# Patient Record
Sex: Male | Born: 2000 | Race: White | Hispanic: No | Marital: Single | Smoking: Never smoker
Health system: Southern US, Community
[De-identification: ages and names within clinical notes are randomized; demographics above are authoritative.]

## PROBLEM LIST (undated history)

## (undated) DIAGNOSIS — H539 Unspecified visual disturbance: Secondary | ICD-10-CM

## (undated) DIAGNOSIS — T7840XA Allergy, unspecified, initial encounter: Secondary | ICD-10-CM

## (undated) DIAGNOSIS — A4902 Methicillin resistant Staphylococcus aureus infection, unspecified site: Secondary | ICD-10-CM

## (undated) HISTORY — DX: Allergy, unspecified, initial encounter: T78.40XA

## (undated) HISTORY — DX: Unspecified visual disturbance: H53.9

## (undated) HISTORY — DX: Methicillin resistant Staphylococcus aureus infection, unspecified site: A49.02

---
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
## 2022-10-20 DIAGNOSIS — Z0000 Encounter for general adult medical examination without abnormal findings: Secondary | ICD-10-CM | POA: Diagnosis not present

## 2022-10-20 DIAGNOSIS — R7989 Other specified abnormal findings of blood chemistry: Secondary | ICD-10-CM | POA: Diagnosis not present

## 2022-11-12 DIAGNOSIS — Z1331 Encounter for screening for depression: Secondary | ICD-10-CM | POA: Diagnosis not present

## 2022-11-12 DIAGNOSIS — Z0000 Encounter for general adult medical examination without abnormal findings: Secondary | ICD-10-CM | POA: Diagnosis not present

## 2022-11-12 DIAGNOSIS — Z1339 Encounter for screening examination for other mental health and behavioral disorders: Secondary | ICD-10-CM | POA: Diagnosis not present

## 2023-11-11 DIAGNOSIS — Z0189 Encounter for other specified special examinations: Secondary | ICD-10-CM | POA: Diagnosis not present

## 2023-11-11 DIAGNOSIS — R7989 Other specified abnormal findings of blood chemistry: Secondary | ICD-10-CM | POA: Diagnosis not present

## 2023-11-11 DIAGNOSIS — Z0000 Encounter for general adult medical examination without abnormal findings: Secondary | ICD-10-CM | POA: Diagnosis not present

## 2023-11-18 DIAGNOSIS — D229 Melanocytic nevi, unspecified: Secondary | ICD-10-CM | POA: Diagnosis not present

## 2023-11-18 DIAGNOSIS — Z1331 Encounter for screening for depression: Secondary | ICD-10-CM | POA: Diagnosis not present

## 2023-11-18 DIAGNOSIS — Z1339 Encounter for screening examination for other mental health and behavioral disorders: Secondary | ICD-10-CM | POA: Diagnosis not present

## 2023-11-18 DIAGNOSIS — Z0000 Encounter for general adult medical examination without abnormal findings: Secondary | ICD-10-CM | POA: Diagnosis not present

## 2023-12-15 DIAGNOSIS — D225 Melanocytic nevi of trunk: Secondary | ICD-10-CM | POA: Diagnosis not present

## 2023-12-15 DIAGNOSIS — D224 Melanocytic nevi of scalp and neck: Secondary | ICD-10-CM | POA: Diagnosis not present

## 2023-12-15 DIAGNOSIS — L578 Other skin changes due to chronic exposure to nonionizing radiation: Secondary | ICD-10-CM | POA: Diagnosis not present

## 2023-12-15 DIAGNOSIS — L814 Other melanin hyperpigmentation: Secondary | ICD-10-CM | POA: Diagnosis not present
